# Patient Record
Sex: Male | Born: 1965 | Hispanic: No | Marital: Single
Health system: Southern US, Community
[De-identification: ages and names within clinical notes are randomized; demographics above are authoritative.]

---

## 2017-07-26 ENCOUNTER — Emergency Department (HOSPITAL_COMMUNITY): Payer: Self-pay

## 2017-07-26 ENCOUNTER — Encounter (HOSPITAL_COMMUNITY): Payer: Self-pay | Admitting: Emergency Medicine

## 2017-07-26 ENCOUNTER — Emergency Department (HOSPITAL_COMMUNITY)
Admission: EM | Admit: 2017-07-26 | Discharge: 2017-07-26 | Disposition: A | Discharge status: Home / Self Care | Payer: Self-pay | Attending: Emergency Medicine | Admitting: Emergency Medicine

## 2017-07-26 DIAGNOSIS — Y999 Unspecified external cause status: Secondary | ICD-10-CM | POA: Insufficient documentation

## 2017-07-26 DIAGNOSIS — S61212A Laceration without foreign body of right middle finger without damage to nail, initial encounter: Secondary | ICD-10-CM | POA: Insufficient documentation

## 2017-07-26 DIAGNOSIS — Y939 Activity, unspecified: Secondary | ICD-10-CM | POA: Insufficient documentation

## 2017-07-26 DIAGNOSIS — W293XXA Contact with powered garden and outdoor hand tools and machinery, initial encounter: Secondary | ICD-10-CM | POA: Insufficient documentation

## 2017-07-26 DIAGNOSIS — S62639A Displaced fracture of distal phalanx of unspecified finger, initial encounter for closed fracture: Secondary | ICD-10-CM | POA: Insufficient documentation

## 2017-07-26 DIAGNOSIS — Y929 Unspecified place or not applicable: Secondary | ICD-10-CM | POA: Insufficient documentation

## 2017-07-26 DIAGNOSIS — S61214A Laceration without foreign body of right ring finger without damage to nail, initial encounter: Secondary | ICD-10-CM | POA: Insufficient documentation

## 2017-07-26 DIAGNOSIS — Z23 Encounter for immunization: Secondary | ICD-10-CM | POA: Insufficient documentation

## 2017-07-26 MED ORDER — HYDROMORPHONE HCL 1 MG/ML IJ SOLN
1.0000 mg | Freq: Once | INTRAMUSCULAR | Status: DC
  Filled 2017-07-26: qty 1

## 2017-07-26 MED ORDER — HYDROMORPHONE HCL 1 MG/ML IJ SOLN
1.0000 mg | Freq: Once | INTRAMUSCULAR | Status: AC
  Administered 2017-07-26: 1 mg via INTRAVENOUS

## 2017-07-26 MED ORDER — LIDOCAINE HCL (PF) 1 % IJ SOLN
20.0000 mL | Freq: Once | INTRAMUSCULAR | Status: AC
  Administered 2017-07-26: 20 mL via INTRADERMAL
  Filled 2017-07-26: qty 20

## 2017-07-26 MED ORDER — TETANUS-DIPHTH-ACELL PERTUSSIS 5-2.5-18.5 LF-MCG/0.5 IM SUSP
0.5000 mL | Freq: Once | INTRAMUSCULAR | Status: AC
  Administered 2017-07-26: 0.5 mL via INTRAMUSCULAR
  Filled 2017-07-26: qty 0.5

## 2017-07-26 NOTE — ED Provider Notes (Signed)
Assumed care of patient at shift range.  Briefly patient is here for evaluation of a right third and fourth finger injury from using a saw.  He has lacerations to his right third and fourth finger and a comminuted distal tuft fracture on the right third finger.  Plan is to await patient evaluation by Dr. Amanda PeaGramig.  Tdap has been updated. Physical Exam  BP (!) 143/84 (BP Location: Right Arm)   Pulse 79   Temp 97.8 F (36.6 C) (Oral)   Resp 16   SpO2 100%   Physical Exam  Constitutional: He appears well-developed. No distress.  HENT:  Head: Normocephalic.  Pulmonary/Chest: No respiratory distress.  Musculoskeletal:  Right hand has been bandaged.   Neurological: He is alert.  Skin: He is not diaphoretic.  Psychiatric: He has a normal mood and affect.  Nursing note and vitals reviewed.   ED Course/Procedures     Procedures  MDM  Patient was seen and evaluated by hand surgery Dr. Amanda PeaGramig who performed repair, please see his note.  He provided patient with paper prescriptions and requested that I discharge the patient.  Return precautions were given to patient.  Patient states his understanding.  Discharged home.       Norman ClayHammond, Elizabeth W, PA-C 07/26/17 Kristeen [id number]    Benjiman CorePickering, Nathan, MD 07/27/17 0030

## 2017-07-26 NOTE — Consult Note (Signed)
Reason for Consult: Saw injury while working on a job to the right ring finger and middle fingers Referring Physician: ER physician  Gary Greene is an [age] y.o. male.  HPI: Patient presents with pain in the middle and ring finger after table saw injury.  He was working on a job and sustained the injury.  He has open fracture about the middle finger with disarray of the soft tissues nail plate nailbed injury and open fracture.  The ring finger has a noted radial digital nerve injury as well as soft tissue disarray.  He has a large portion of his skin that is devoid of coverage.  This is due to skin loss down to the neurovascular bundle radially.  His flexor tendon is intact.  He denies other injury he denies neck back chest or abdominal pain he is alert and oriented in no acute distress.  Vital signs are stable.  I reviewed his past medical and surgical history at great length.  History reviewed. No pertinent past medical history.  History reviewed. No pertinent surgical history.  No family history on file.  Social History:  has no tobacco, alcohol, and drug history on file.  Allergies: No Known Allergies  Medications: I have reviewed the patient's current medications.  No results found for this or any previous visit (from the past 48 hour(s)).  Dg Hand Complete Right  Result Date: 07/26/2017 CLINICAL DATA:  Laceration distal right middle and ring fingers from power tool. EXAM: RIGHT HAND - COMPLETE 3+ VIEW COMPARISON:  None. FINDINGS: Examination demonstrates mild degenerative changes. There is a displaced comminuted fracture of the third distal phalanx. Soft tissue injury to the distal third and fourth fingers. No foreign body. IMPRESSION: Displaced comminuted fracture of the third distal phalanx. Electronically Signed   By: Elberta Fortisaniel  Boyle M.D.   On: 07/26/2017 14:23    Review of Systems  Respiratory: Negative.   Cardiovascular: Negative.   Gastrointestinal: Negative.    Genitourinary: Negative.    Blood pressure (!) 128/108, pulse 70, temperature 97.8 F (36.6 C), temperature source Oral, resp. rate 18, SpO2 100 %. Physical Exam  He has open fracture about the middle finger with disarray of the soft tissues nail plate nailbed injury and open fracture.  The ring finger has a noted radial digital nerve injury as well as soft tissue disarray.  He has a large portion of his skin that is devoid of coverage.  This is due to skin loss down to the neurovascular bundle radially.  His flexor tendon is intact.   Patient has significant injury with open fracture about the middle finger as noted.  X-rays are reviewed and correlate to this.  The ring finger is fairly stable to bone architecture. The patient is alert and oriented in no acute distress. The patient complains of pain in the affected upper extremity.  The patient is noted to have a normal HEENT exam. Lung fields show equal chest expansion and no shortness of breath. Abdomen exam is nontender without distention. Lower extremity examination does not show any fracture dislocation or blood clot symptoms. Pelvis is stable and the neck and back are stable and nontender. Assessment/Plan: Open fracture right middle finger with soft tissue disarray including nailbed and nail plate injury.  Patient also has associated ring finger table saw injury with radial digital nerve injury which is segmental in nature.  I discussed this with him at length including options of nerve grafting versus resection etc.  We will plan to proceed  with surgical intervention.  We are planning surgery for your upper extremity. The risk and benefits of surgery to include risk of bleeding, infection, anesthesia,  damage to normal structures and failure of the surgery to accomplish its intended goals of relieving symptoms and restoring function have been discussed in detail. With this in mind we plan to proceed. I have specifically discussed with  the patient the pre-and postoperative regime and the dos and don'ts and risk and benefits in great detail. Risk and benefits of surgery also include risk of dystrophy(CRPS), chronic nerve pain, failure of the healing process to go onto completion and other inherent risks of surgery The relavent the pathophysiology of the disease/injury process, as well as the alternatives for treatment and postoperative course of action has been discussed in great detail with the patient who desires to proceed.  We will do everything in our power to help you (the patient) restore function to the upper extremity. It is a pleasure to see this patient today.     Procedure: #1 irrigation debridement right middle finger skin subtenons tissue bone and associated soft tissues this was an excisional debridement with curette knife blade and scissor #2 nail plate removal right middle finger #3 nailbed repair right middle finger #4 open treatment distal phalanx fracture right middle finger #5 volar advancement flap right middle finger for coverage secondary to skill saw injury #6 nail plate removal right ring finger #7 burying procedure radial digital nerve right ring finger secondary to segmental avulsion injury #8 volar advancement flap right middle finger for coverage   Description of procedure: Patient was taken to the procedure suite he underwent a block followed by 3 separate Betadine scrubs.  2 L of saline were placed into the areas.  At this time we performed a very thorough and significant washout.  This was an irrigation debridement of right middle finger and right ring finger skin subtenons tissue bone tendon and associated soft tissue structures as necessary.  The patient had a curette knife and scissor use for this.  Depth was 1 to 2 cm and length was 3 cm.  Following this nail plate removal was accomplished about the middle finger and the ring finger.  Following this I then looked at the middle finger and performed  a open treatment of the distal phalanx fracture.  I did not feel the hardware would be a good choice for him given the fact that he has significant soft tissue disarray and I did not want to promote any infection.  The bone was relatively stable.  Following this we performed nailbed repair with 6-0 chromic suture and a 4.0 loupe magnification.  This was a nailbed repair along with the nail plate removal.  The open fracture was set and following this skin was sculpted and a volar advancement flap was used to achieve coverage.  Adaptic was placed under the eponychial fold and a brief tourniquet was used followed by deflation and excellent refill noted.  Following this attention was turned back towards the ring finger nail plate removal was accomplished followed by evaluation of the segmental injury to the radial digital nerve and artery.  This was segmental and there is no way to meaningfully repair this.  Given the level at the trifurcation we chose to perform a bearing procedure with crushing technique and patient tolerated this reasonably well.  Following this I skin cut areas and sculpted the skin nicely so as to allow for a volar flap advancement for skin coverage.  The patient tolerated this well.  Fortunately the ulnar digital artery and nerve are intact and the flexor tendon is intact with good flexion.  A tourniquet was up for a brief period of time and this was then deflated and refill was excellent.  Adaptic under the eponychial fold followed by sterile noncompressive dressing and finger splints were applied.  He tolerated procedure well.  We will plan for RTC in 10 to 14 days to see myself and therapy followed by sequential evaluation in my office.  Keflex 500 4 times daily x10 days and OxyIR as needed pain as well as Robaxin as needed spasm were dispensed.  All questions were encouraged and answered.   Keep bandage clean and dry.  Call for any problems.  No smoking.  Criteria for driving a  car: you should be off your pain medicine for 7-8 hours, able to drive one handed(confident), thinking clearly and feeling able in your judgement to drive. Continue elevation as it will decrease swelling.  If instructed by MD move your fingers within the confines of the bandage/splint.  Use ice if instructed by your MD. Call immediately for any sudden loss of feeling in your hand/arm or change in functional abilities of the extremity. We recommend that you to take vitamin C 1000 mg a day to promote healing. We also recommend that if you require  pain medicine that you take a stool softener to prevent constipation as most pain medicines will have constipation side effects. We recommend either Peri-Colace or Senokot and recommend that you also consider adding MiraLAX as well to prevent the constipation affects from pain medicine if you are required to use them. These medicines are over the counter and may be purchased at a local pharmacy. A cup of yogurt and a probiotic can also be helpful during the recovery process as the medicines can disrupt your intestinal environment.  Dionne Ano Ishan Sanroman III 07/26/2017, 6:29 PM

## 2017-07-26 NOTE — ED Provider Notes (Signed)
[facility] EMERGENCY DEPARTMENT Provider Note   CSN: [id number] Arrival date & time: 07/26/17  1342     History   Chief Complaint Chief Complaint  Patient presents with  . Finger Injury    HPI Jolly Bleicher is a 52 y.o. male.  HPI   Patient is a 52yo male with no significant past medical history who presents to the emergency department for evaluation of right third and fourth finger injury.  Patient reports that he was using a hand saw about 1 PM today when he accidentally hit his right third and fourth fingers with the saw.  Reports that there was immediate bleeding, but this was controlled with pressure.  Did not try to clean the wound prior to arrival.  He states that he now has pain over the distal aspect of the third and fourth finger which 10/10 in severity and is worsened with palpation.  No medication prior to arrival.  He is unsure of his last tetanus vaccine.  Denies fevers, chills, numbness, weakness, wound or pain elsewhere. Is not on blood thinners.   History reviewed. No pertinent past medical history.  There are no active problems to display for this patient.   History reviewed. No pertinent surgical history.      Home Medications    Prior to Admission medications   Not on File    Family History No family history on file.  Social History Social History   Tobacco Use  . Smoking status: Not on file  Substance Use Topics  . Alcohol use: Not on file  . Drug use: Not on file     Allergies   Patient has no known allergies.   Review of Systems Review of Systems  Constitutional: Negative for chills and fever.  Musculoskeletal: Positive for arthralgias (right third and fourth fingers). Negative for gait problem.  Skin: Positive for wound.  Neurological: Negative for weakness and numbness.     Physical Exam Updated Vital Signs BP (!) 143/84 (BP Location: Right Arm)   Pulse 79   Temp 97.8 F (36.6 C) (Oral)   Resp  16   SpO2 100%   Physical Exam  Constitutional: He is oriented to person, place, and time. He appears well-developed and well-nourished. No distress.  NAD  HENT:  Head: Normocephalic and atraumatic.  Eyes: Right eye exhibits no discharge. Left eye exhibits no discharge.  Pulmonary/Chest: Effort normal. No respiratory distress.  Musculoskeletal:  Right fourth finger with 2.5 cm avulsion of tissue over the radial side of the distal finger with associated nail bed injury (see image below.) Right third finger with laceration over the base of the nail as well as distal tip avulsion of tissue (see image below.) Bleeding controlled. No surrounding finger swelling or erythema. Exquisitely tender to palpation over distal third and fourth fingers. Full flexion of right third and fourth fingers, although painful.  Cap refill <2sec. Radial pulse 2+. Sensation to light touch intact in pads of the right third and fourth fingers.   Neurological: He is alert and oriented to person, place, and time. Coordination normal.  Skin: Skin is warm and dry. Capillary refill takes less than 2 seconds. He is not diaphoretic.  Psychiatric: He has a normal mood and affect. His behavior is normal.  Nursing note and vitals reviewed.              ED Treatments / Results  Labs (all labs ordered are listed, but only abnormal results are displayed) Labs  Reviewed - No data to display  EKG None  Radiology Dg Hand Complete Right  Result Date: 07/26/2017 CLINICAL DATA:  Laceration distal right middle and ring fingers from power tool. EXAM: RIGHT HAND - COMPLETE 3+ VIEW COMPARISON:  None. FINDINGS: Examination demonstrates mild degenerative changes. There is a displaced comminuted fracture of the third distal phalanx. Soft tissue injury to the distal third and fourth fingers. No foreign body. IMPRESSION: Displaced comminuted fracture of the third distal phalanx. Electronically Signed   By: Elberta Fortisaniel  Boyle M.D.   On:  07/26/2017 14:23    Procedures Procedures (including critical care time)  Medications Ordered in ED Medications  Tdap (BOOSTRIX) injection 0.5 mL (has no administration in time range)  HYDROmorphone (DILAUDID) injection 1 mg (has no administration in time range)  lidocaine (PF) (XYLOCAINE) 1 % injection 20 mL (20 mLs Intradermal Given 07/26/17 1620)     Initial Impression / Assessment and Plan / ED Course  I have reviewed the triage vital signs and the nursing notes.  Pertinent labs & imaging results that were available during my care of the patient were reviewed by me and considered in my medical decision making (see chart for details).    Patient presents with right hand injury after hitting third and fourth fingers against a hand saw earlier today. Xray right hand shows comminuted distal phalynx fracture of the right third finger. On exam right hand and fingers neurovascularly intact. He has nail injury and avulsion of tissue on the right third and fourth tissues. The wounds were cleaned with pressure wash following finger block of the right third and fourth digits. I spoke with Hand Surgeon Dr. Amanda PeaGramig about this patient over the phone and he is going to come see the patient in the ED. Patient is NPO pending Dr. Carlos LeveringGramig's recommendation. Sign out given at shift change to oncoming PA Lyndel SafeElizabeth Hammond for disposition following consult.   Final Clinical Impressions(s) / ED Diagnoses   Final diagnoses:  None    ED Discharge Orders    None       Lawrence MarseillesShrosbree, Michaelina Blandino J, PA-C 07/26/17 1716    Benjiman CorePickering, Nathan, MD 07/27/17 0030

## 2017-07-26 NOTE — ED Triage Notes (Signed)
Patient complains of laceration to right middle and ring fingers from a table saw. Hemorrhage controlled. Patient received fentanyl en route. Patient alert, oriented, and ambulating independently with steady gait.

## 2017-07-26 NOTE — Discharge Instructions (Addendum)
Please take your prescriptions given to you by Dr. Amanda PeaGramig.  Please follow-up according to his instructions in about 10-14 days in the office.

## 2019-08-03 IMAGING — DX DG HAND COMPLETE 3+V*R*
3 series · 3 of 3 positions shown · non-contrast
Comparison: None.

CLINICAL DATA: Laceration distal right middle and ring fingers from
power tool.

EXAM:
RIGHT HAND - COMPLETE 3+ VIEW

[hand pa]
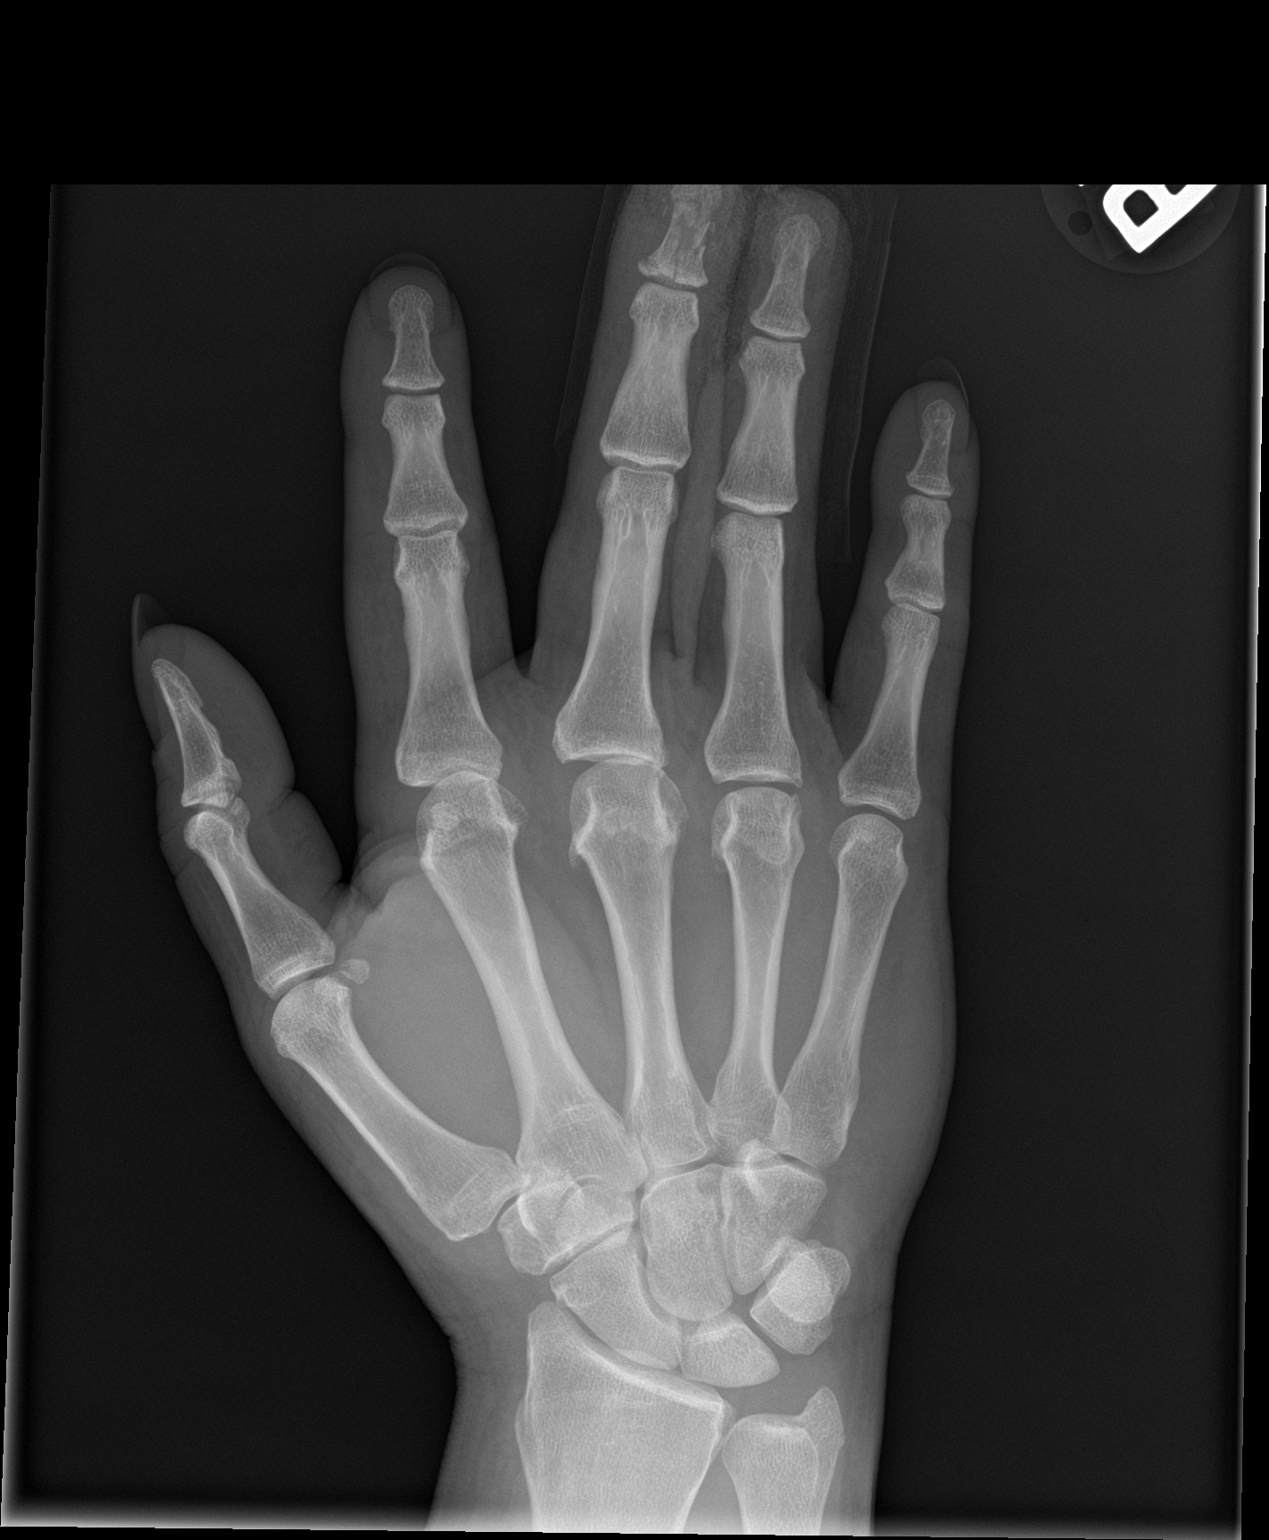

[hand obl]
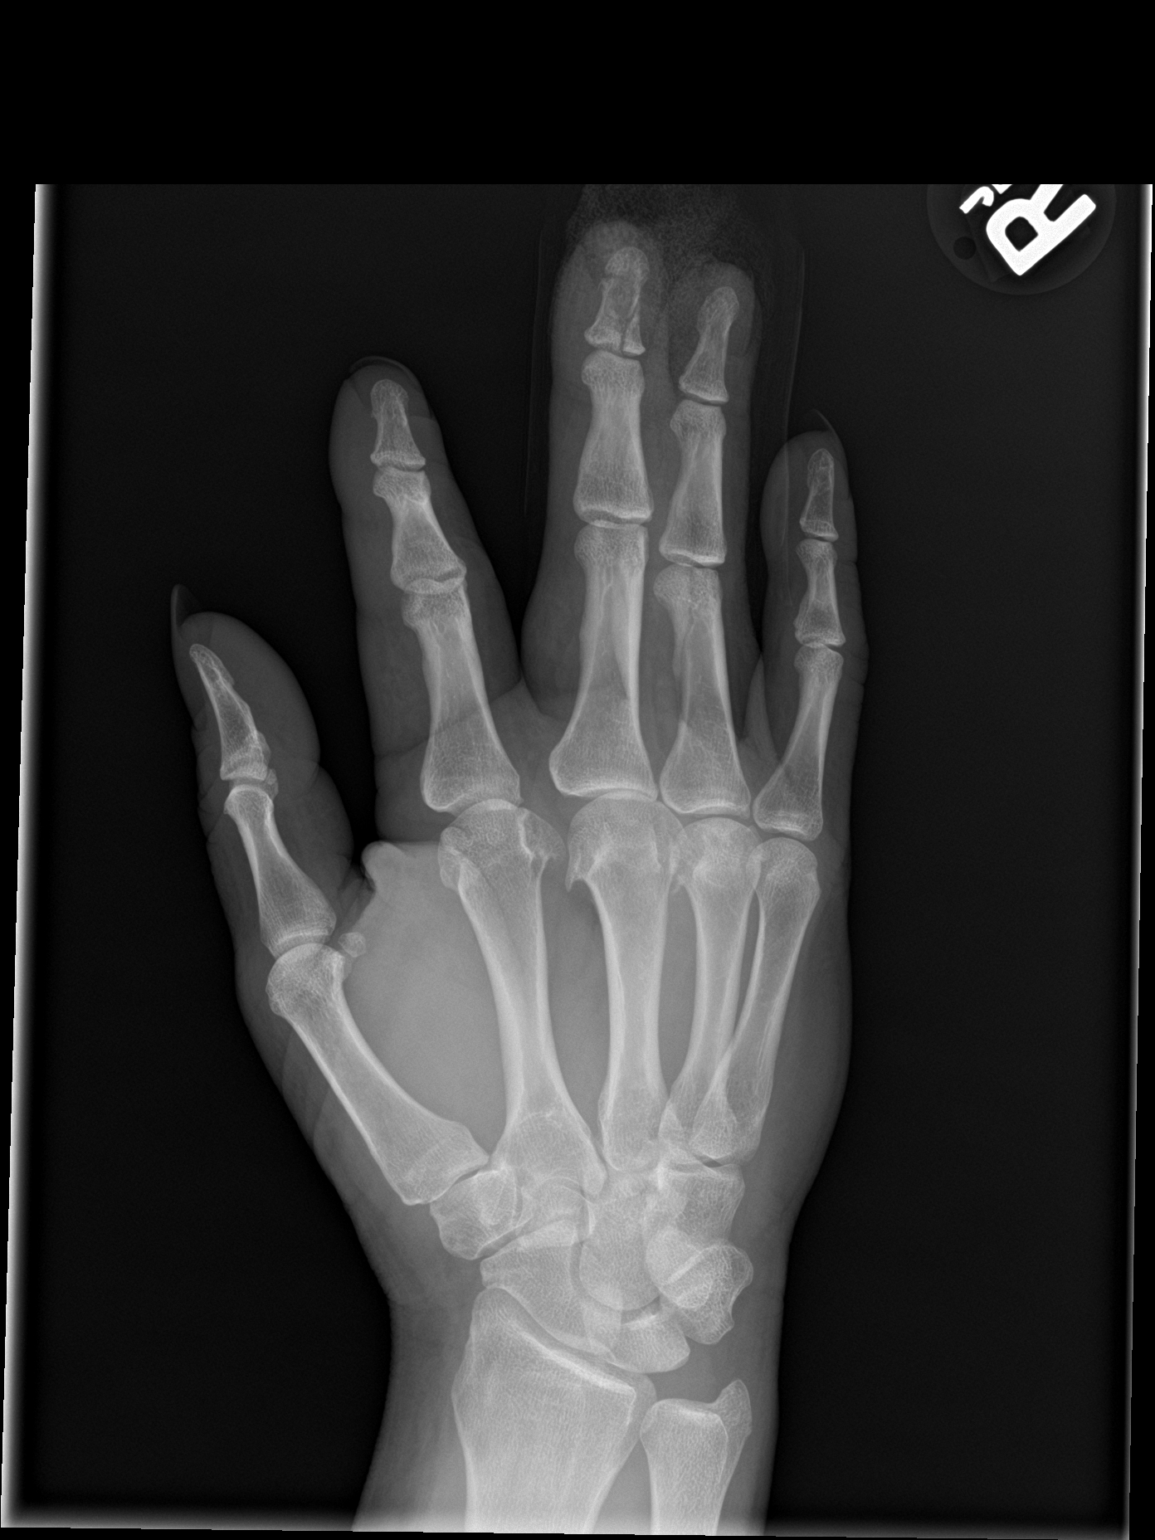

[hand lat]
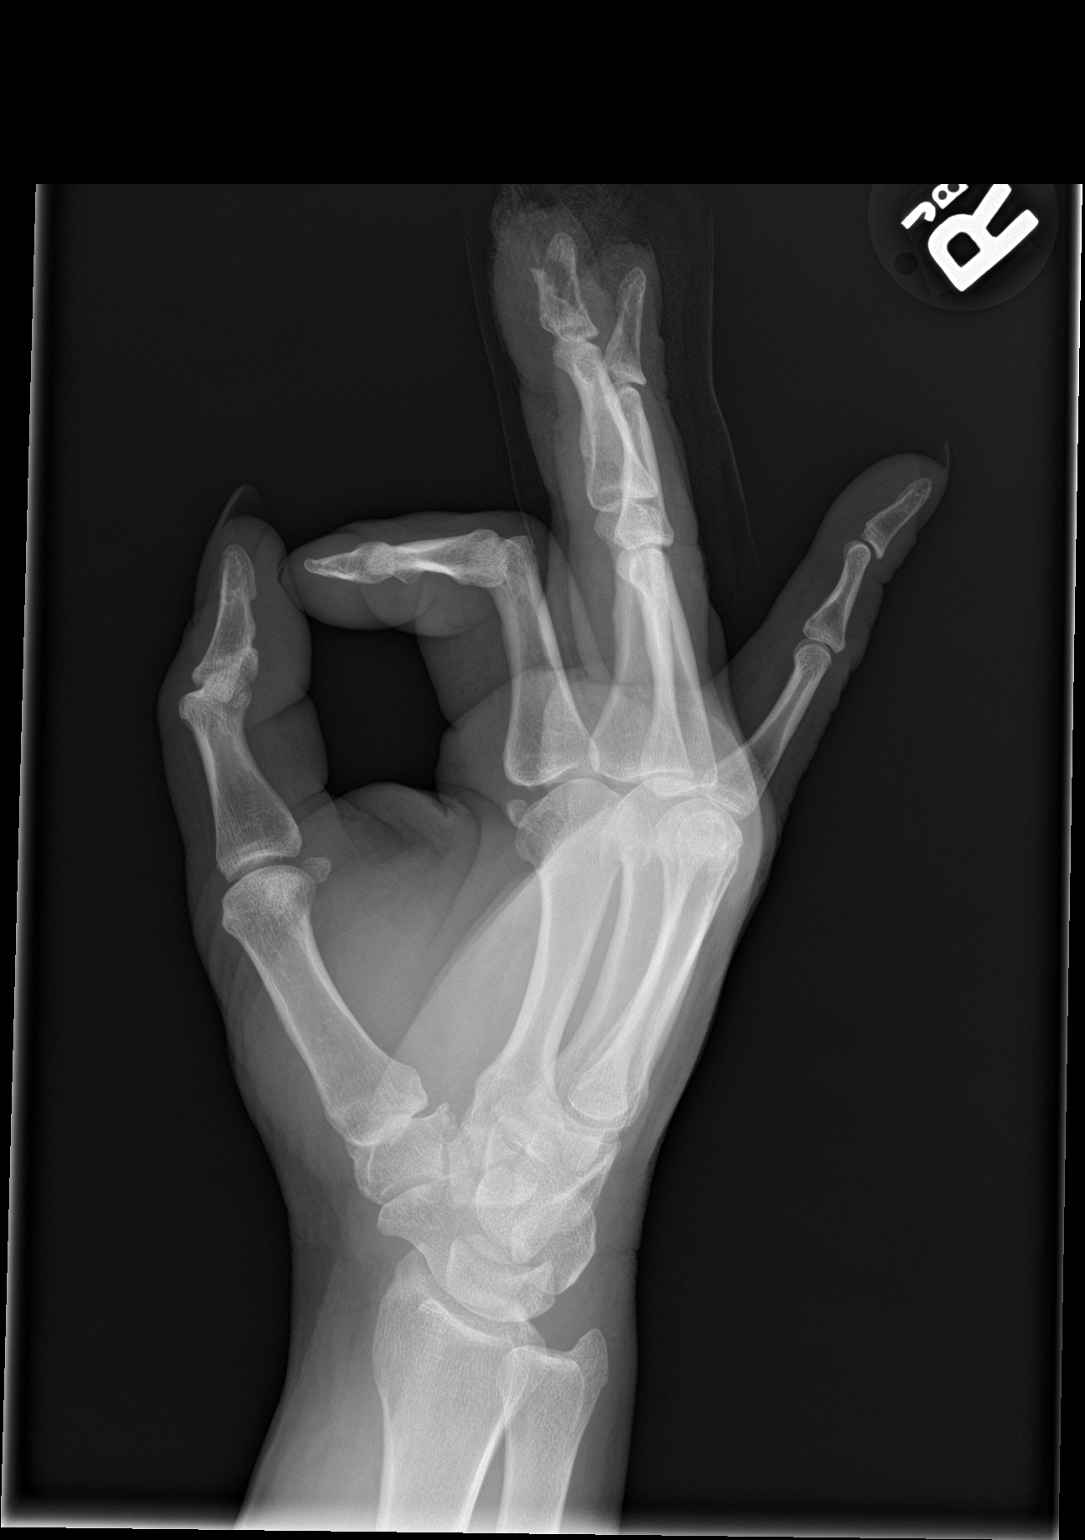

[3 of 3 positions shown; findings below may reference images not displayed]

FINDINGS: Examination demonstrates mild degenerative changes. There is a
displaced comminuted fracture of the third distal phalanx. Soft
tissue injury to the distal third and fourth fingers. No foreign
body.
IMPRESSION: Displaced comminuted fracture of the third distal phalanx.
